# Patient Record
Sex: Male | Born: 2000 | Race: White | Hispanic: No | Marital: Single | Smoking: Never smoker
Health system: Southern US, Community
[De-identification: ages and names within clinical notes are randomized; demographics above are authoritative.]

## PROBLEM LIST (undated history)

## (undated) DIAGNOSIS — N200 Calculus of kidney: Secondary | ICD-10-CM

---
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
## 2021-06-18 ENCOUNTER — Other Ambulatory Visit: Payer: Self-pay

## 2021-06-18 ENCOUNTER — Emergency Department (HOSPITAL_BASED_OUTPATIENT_CLINIC_OR_DEPARTMENT_OTHER)
Admission: EM | Admit: 2021-06-18 | Discharge: 2021-06-18 | Disposition: A | Discharge status: Home / Self Care | Payer: Medicaid Other | Attending: Emergency Medicine | Admitting: Emergency Medicine

## 2021-06-18 ENCOUNTER — Encounter (HOSPITAL_BASED_OUTPATIENT_CLINIC_OR_DEPARTMENT_OTHER): Payer: Self-pay | Admitting: Urology

## 2021-06-18 DIAGNOSIS — R519 Headache, unspecified: Secondary | ICD-10-CM | POA: Insufficient documentation

## 2021-06-18 DIAGNOSIS — H9221 Otorrhagia, right ear: Secondary | ICD-10-CM | POA: Diagnosis present

## 2021-06-18 DIAGNOSIS — W228XXA Striking against or struck by other objects, initial encounter: Secondary | ICD-10-CM | POA: Diagnosis not present

## 2021-06-18 NOTE — Discharge Instructions (Signed)
There is no laceration on your exam today.  Area of dried blood was cleaned off without evidence of bleeding underneath.  Continue taking Tylenol and Motrin for your headache.  There is no signs or concerns for underlying head injury that would warrant imaging at this time.  If you have worsening symptoms, persistent nausea vomiting, or uncontrolled bleeding please return to the emergency room. ?

## 2021-06-18 NOTE — ED Provider Notes (Signed)
?[facility] EMERGENCY DEPARTMENT ?Provider Note ? ? ?CSN: [id number] ?Arrival date & time: 06/18/21  1740 ? ?  ? ?History ? ?Chief Complaint  ?Patient presents with  ? Ear Injury  ? ? ?Addie Cederberg is a 21 y.o. male. ? ?21 year old male presents with his aunt for evaluation of bleeding from his right ear.  Patient states he was getting up from underneath his bed and as he was getting up he hit his right ear onto a square metal rod.  He states right after he had bleeding from his right ear.  Endorses a headache.  Denies loss of consciousness, nausea, vomiting, change in vision.  States bleeding was controlled with pressure.  Denies any other complaints. ? ?The history is provided by the patient. No language interpreter was used.  ? ?  ? ?Home Medications ?Prior to Admission medications   ?Medication Sig Start Date End Date Taking? Authorizing Provider  ?HYDROcodone-acetaminophen (NORCO/VICODIN) 5-325 MG tablet Take 1 tablet by mouth every 6 (six) hours as needed for severe pain. 01/25/20   Walisiewicz, Yvonna Alanis E, PA-C  ?ondansetron (ZOFRAN ODT) 4 MG disintegrating tablet Take 1 tablet (4 mg total) by mouth every 8 (eight) hours as needed for nausea or vomiting. 01/25/20   Namon Cirri E, PA-C  ?diphenhydrAMINE (BENADRYL) 12.5 MG/5ML liquid Take 10 mg by mouth 4 (four) times daily as needed. For allergies.  01/25/20  [provider]  ?   ? ?Allergies    ?Patient has no known allergies.   ? ?Review of Systems   ?Review of Systems  ?Constitutional:  Negative for chills and fever.  ?HENT:  Positive for ear pain. Negative for ear discharge.   ?Eyes:  Negative for visual disturbance.  ?Neurological:  Positive for headaches.  ?All other systems reviewed and are negative. ? ?Physical Exam ?Updated Vital Signs ?BP (!) 130/92 (BP Location: Right Arm)   Pulse 81   Temp 99.1 ?F (37.3 ?C) (Oral)   Resp 18   Ht 5\' 7"  (1.702 m)   Wt 87.7 kg   SpO2 100%   BMI 30.28 kg/m?  ?Physical Exam ?Vitals and  nursing note reviewed.  ?Constitutional:   ?   General: He is not in acute distress. ?   Appearance: Normal appearance. He is not ill-appearing.  ?HENT:  ?   Head: Normocephalic and atraumatic.  ?   Right Ear: Hearing and tympanic membrane normal.  ?   Left Ear: Hearing, tympanic membrane, ear canal and external ear normal.  ?   Ears:  ?   Comments: Dry blood in the canal entry of right ear.  TM intact, and normal.  Without signs of active bleeding.  ?   Nose: Nose normal.  ?Eyes:  ?   Conjunctiva/sclera: Conjunctivae normal.  ?Pulmonary:  ?   Effort: Pulmonary effort is normal. No respiratory distress.  ?Musculoskeletal:     ?   General: No deformity.  ?Skin: ?   Findings: No rash.  ?Neurological:  ?   Mental Status: He is alert.  ? ? ?ED Results / Procedures / Treatments   ?Labs ?(all labs ordered are listed, but only abnormal results are displayed) ?Labs Reviewed - No data to display ? ?EKG ?None ? ?Radiology ?No results found. ? ?Procedures ?Procedures  ? ? ?Medications Ordered in ED ?Medications - No data to display ? ?ED Course/ Medical Decision Making/ A&P ?  ?                        ?  Medical Decision Making ? ?21 year old male presents with his aunt for evaluation of bleeding from right ear after colliding with a square metal rod.  On exam dried blood was noted in the canal entry of the right ear.  TM was intact and normal.  Without active bleeding.  Canal was cleaned with normal saline and 4 x 4 gauze.  The tried blood was removed without signs of active bleeding.  He did not lose consciousness, has not had ongoing nausea vomiting, visual change.  Low risk for intracranial injury.  No laceration apparent on exam.  Patient is appropriate for discharge.  Discharge in stable condition.  Return precautions discussed. ? ? ?Final Clinical Impression(s) / ED Diagnoses ?Final diagnoses:  ?Bleeding from right ear  ? ? ?Rx / DC Orders ?ED Discharge Orders   ? ? None  ? ?  ? ? ?  ?Marita [place], PA-C ?06/18/21 2216 ? ?   ?Milagros Loll, MD ?06/19/21 1755 ? ?

## 2021-06-18 NOTE — ED Triage Notes (Signed)
Hit ear/head on metal frame under bed approx 30 min pta  ?States bleeding from right ear  ?States headache  ?Denies any LOC  ?Denies N/V ?

## 2021-06-24 IMAGING — US US RENAL
1 series · 14 of 25 positions shown · non-contrast
Comparison: Renal ultrasound dated 04/18/2018 and abdomen
radiograph dated 01/25/2020.

CLINICAL DATA: Left lower quadrant pain

EXAM:
RENAL / URINARY TRACT ULTRASOUND COMPLETE

[Series 1: us renal · 14 of 47 slices shown]
[im 1/47]
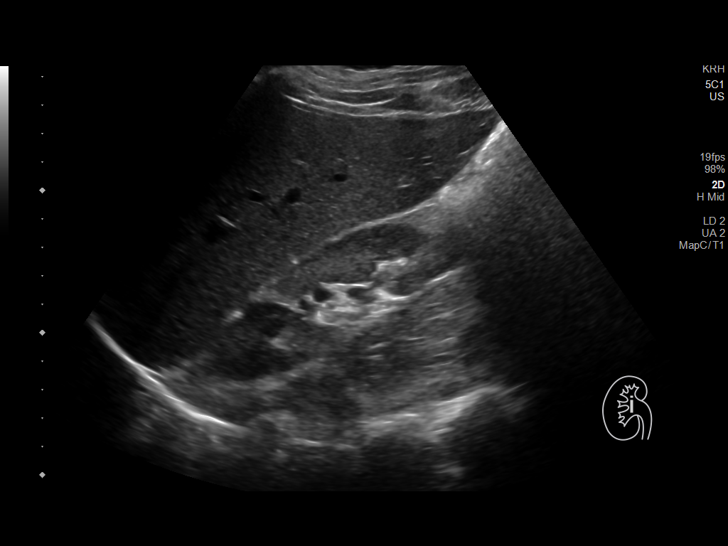
[im 4/47]
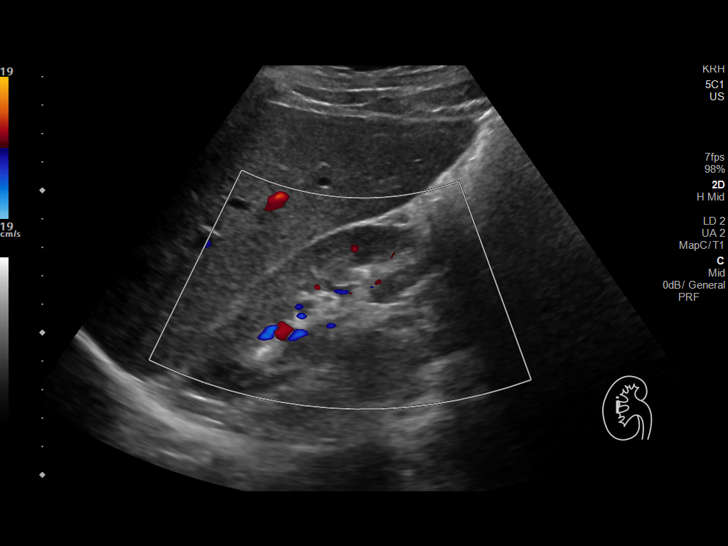
[im 8/47]
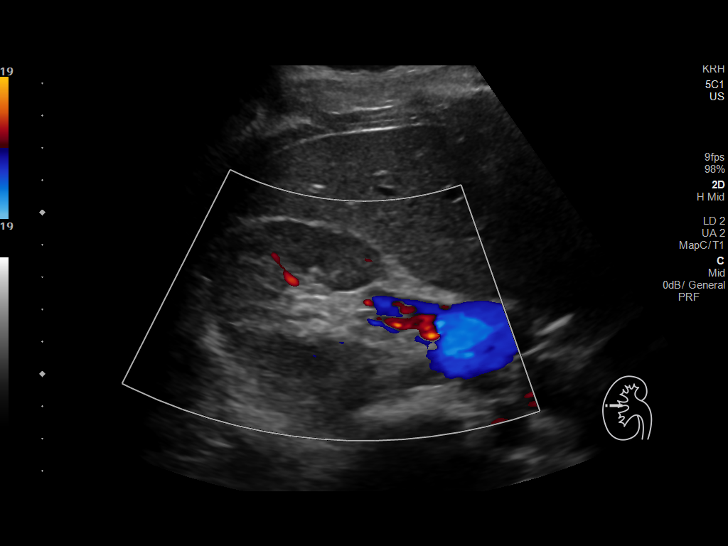
[im 12/47]
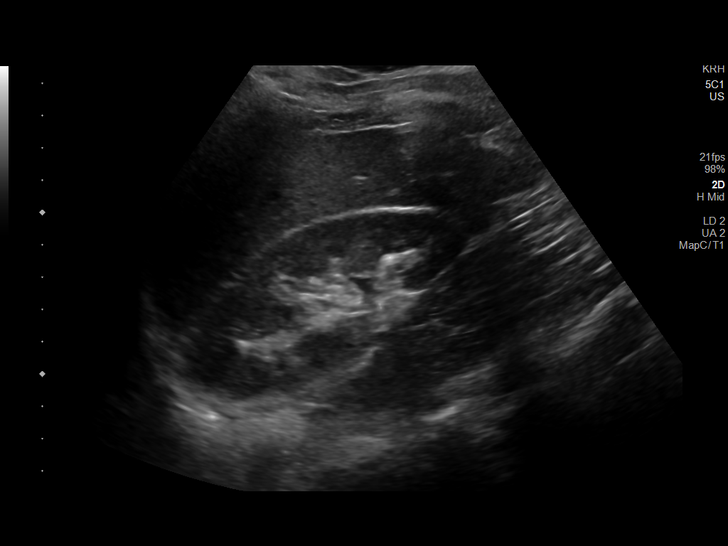
[im 16/47]
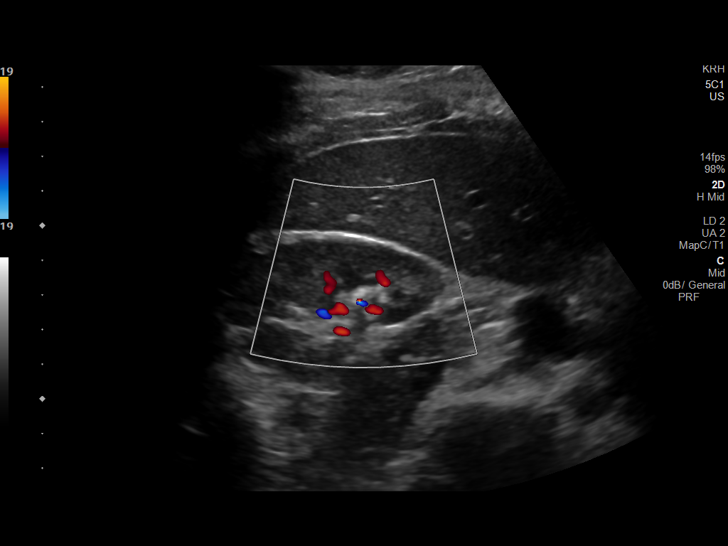
[im 18/47]
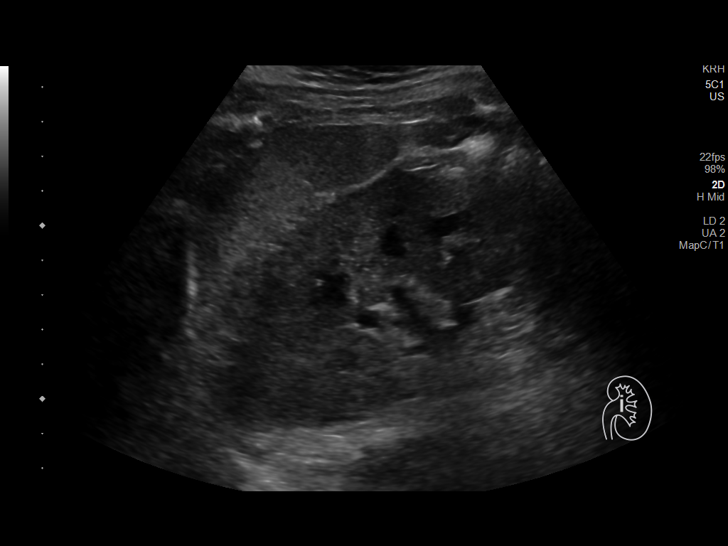
[im 22/47]
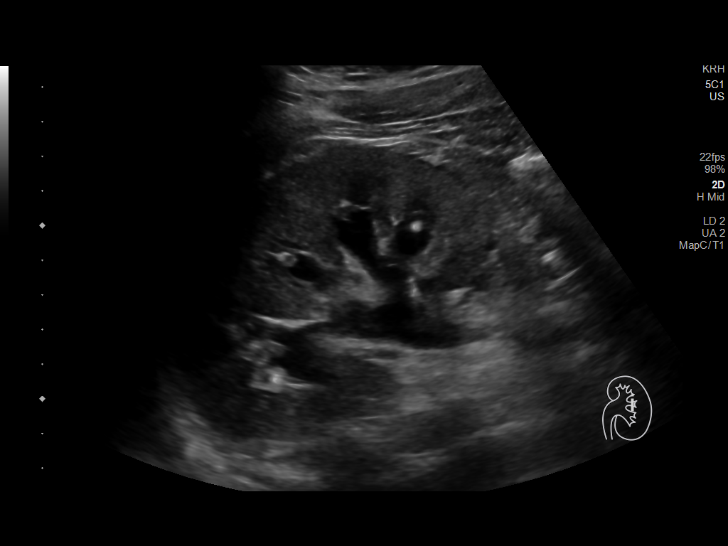
[im 25/47]
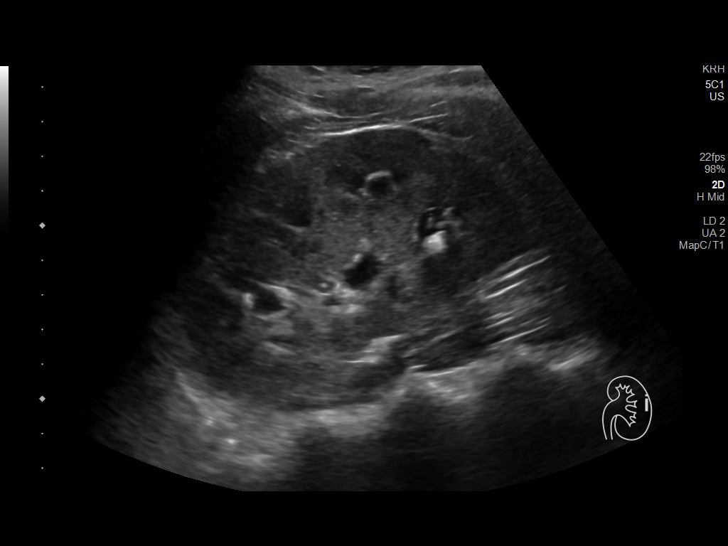
[im 29/47]
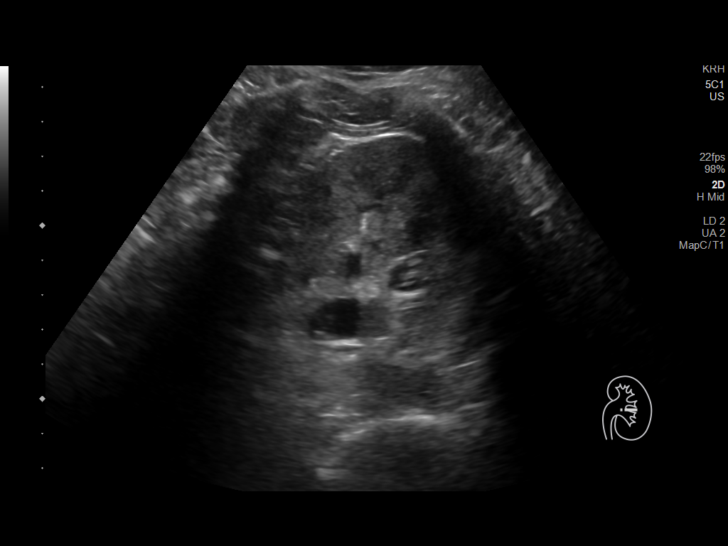
[im 31/47]
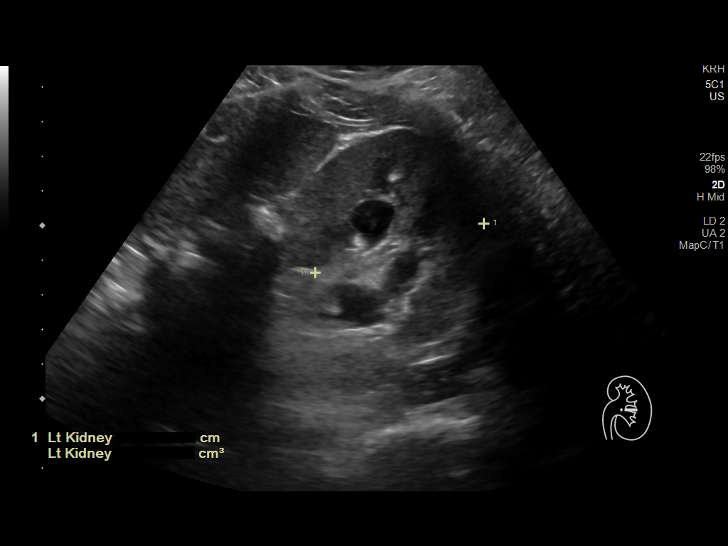
[im 35/47]
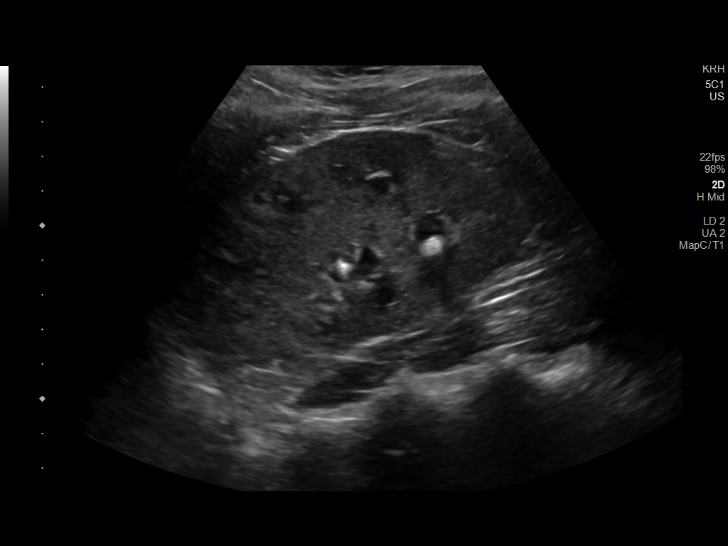
[im 39/47]
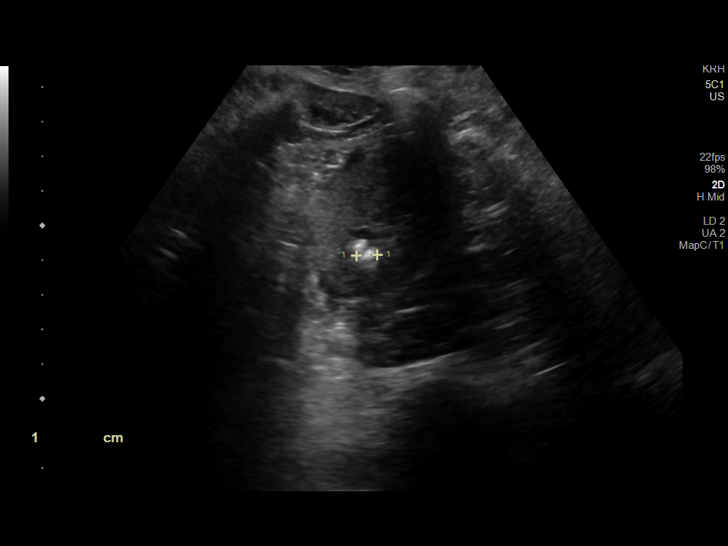
[im 43/47]
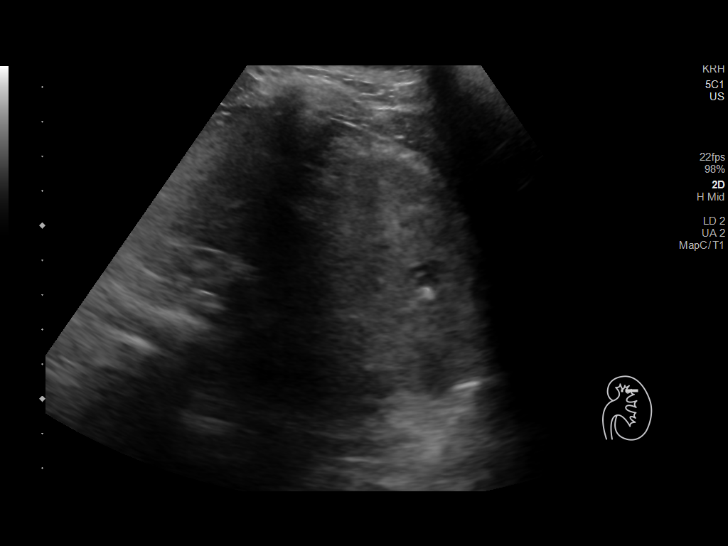
[im 47/47]
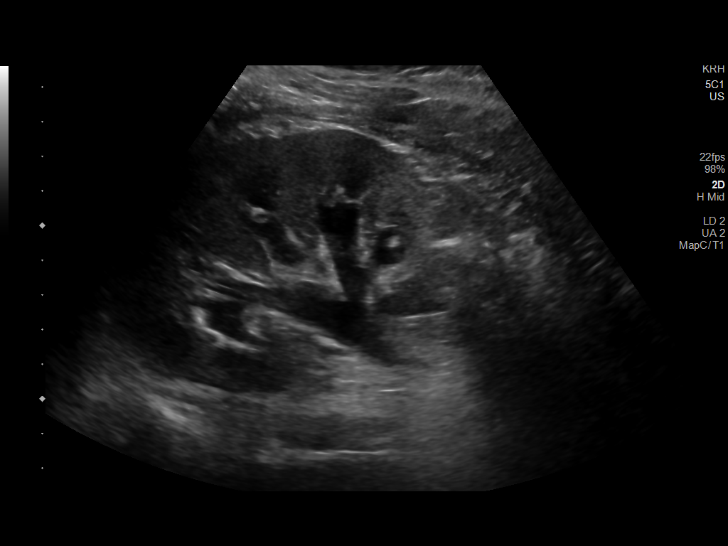

[14 of 25 positions shown; findings below may reference images not displayed]

FINDINGS: Right Kidney:

Renal measurements: 10.9 x 4.1 x 4.7 cm = volume: 109 mL.
Echogenicity within normal limits. Small echogenic foci measure up
to 5 mm in likely represent nonobstructing renal stones. No
hydronephrosis visualized.

Left Kidney:

Renal measurements: 10.9 x 6.1 x 5.1 cm = volume: 177 mL.
Echogenicity within normal limits. Small echogenic foci measure up
to 7 mm in likely represent nonobstructing renal stones. Mild left
hydronephrosis is noted.

Bladder:

Visualized portions appear normal.

Other:

None.
IMPRESSION: 1. Mild left hydronephrosis. This may be related to a possible left
ureteral stone seen on same day abdominal radiograph.
2. Small echogenic foci in both kidneys, likely represent
nonobstructing renal stones.

## 2021-06-24 IMAGING — CR DG ABDOMEN 1V
1 series · 1 of 1 positions shown · non-contrast
Comparison: 06/21/2018.

CLINICAL DATA: Left lower quadrant abdominal pain radiating to the
left groin today.

EXAM:
ABDOMEN - 1 VIEW

[t abdomen supine]
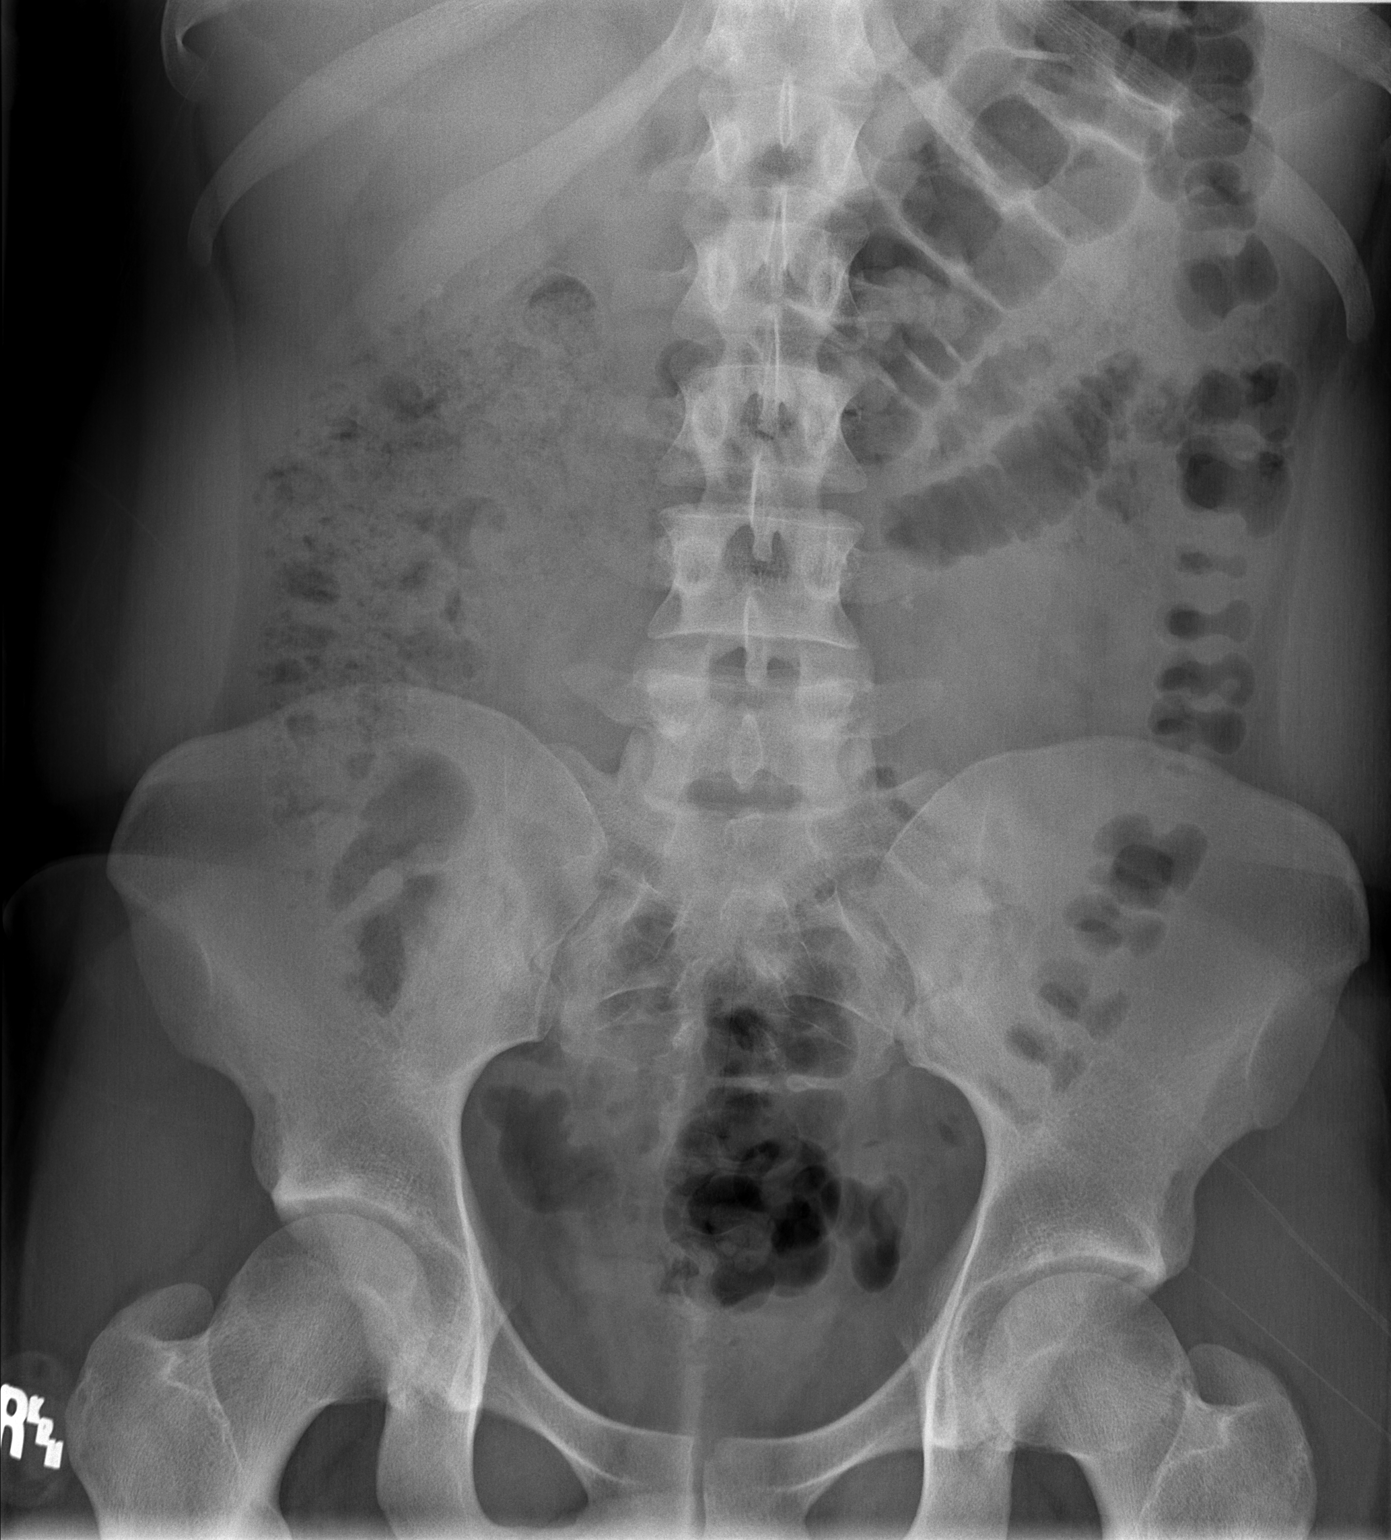

[1 of 1 positions shown; findings below may reference images not displayed]

FINDINGS: Small, approximately 3 mm, density lies just below the left
transverse process of L4, consistent with a left proximal to mid
ureter stone.

There are 2 small stones that project within the lower pole of the
right kidney. The left renal shadow is mostly obscured by overlying
bowel gas and stool.

No bowel dilation to suggest obstruction. Mild to moderate increased
colonic stool burden.

Skeletal structures are unremarkable.
IMPRESSION: 1. Findings consistent with a small, approximately 3 mm, proximal to
mid left ureteral stone.
2. No other acute finding.
3. Two small nonobstructing stones in the lower pole the right
kidney.
